# Patient Record
Sex: Male | Born: 2000 | Race: Black or African American | Hispanic: No | Marital: Single | Smoking: Never smoker
Health system: Southern US, Community
[De-identification: ages and names within clinical notes are randomized; demographics above are authoritative.]

## PROBLEM LIST (undated history)

## (undated) DIAGNOSIS — T7840XA Allergy, unspecified, initial encounter: Secondary | ICD-10-CM

## (undated) DIAGNOSIS — J45909 Unspecified asthma, uncomplicated: Secondary | ICD-10-CM

## (undated) DIAGNOSIS — F419 Anxiety disorder, unspecified: Secondary | ICD-10-CM

## (undated) HISTORY — DX: Anxiety disorder, unspecified: F41.9

## (undated) HISTORY — DX: Unspecified asthma, uncomplicated: J45.909

## (undated) HISTORY — DX: Allergy, unspecified, initial encounter: T78.40XA

---
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
## 2019-01-08 ENCOUNTER — Other Ambulatory Visit: Payer: Self-pay | Admitting: Family Medicine

## 2019-02-10 ENCOUNTER — Ambulatory Visit: Payer: Medicaid Other | Admitting: Family Medicine

## 2019-02-26 ENCOUNTER — Other Ambulatory Visit: Payer: Self-pay | Admitting: Family Medicine

## 2019-06-19 ENCOUNTER — Other Ambulatory Visit: Payer: Self-pay | Admitting: Family Medicine

## 2019-07-07 ENCOUNTER — Ambulatory Visit (INDEPENDENT_AMBULATORY_CARE_PROVIDER_SITE_OTHER): Payer: Self-pay | Admitting: Family Medicine

## 2019-07-07 ENCOUNTER — Other Ambulatory Visit: Payer: Self-pay

## 2019-07-07 ENCOUNTER — Encounter: Payer: Self-pay | Admitting: Family Medicine

## 2019-07-07 VITALS — BP 110/64 | HR 73 | Ht 69.75 in | Wt 166.0 lb

## 2019-07-07 DIAGNOSIS — Z23 Encounter for immunization: Secondary | ICD-10-CM

## 2019-07-07 DIAGNOSIS — Z0000 Encounter for general adult medical examination without abnormal findings: Secondary | ICD-10-CM

## 2019-07-07 DIAGNOSIS — Z113 Encounter for screening for infections with a predominantly sexual mode of transmission: Secondary | ICD-10-CM

## 2019-07-07 NOTE — Progress Notes (Signed)
Adolescent Well Care Visit Perlie Goldubrey Cohill is a 18 y.o. male who is here for well care.    PCP:  Gianelle Mccaul, [place], DO   History was provided by the patient.  Confidentiality was discussed with the patient and, if applicable, with caregiver as well.  Current Issues: Current concerns include none.   Patient is starting college in the fall and is here for his physical prior to going to college as well as his meningitis vaccine.  Nutrition: Nutrition/Eating Behaviors: Eats a well-balanced diet.  Exercise/ Media: Play any Sports?/ Exercise: He exercises and play sports with friends Screen Time:  > 2 hours-counseling provided Media Rules or Monitoring?: no  Sleep:  Sleep: Patient reports that he sleeps well at night  Social Screening: Lives with: Family now but is moving on to [place] Parental relations:  good Activities, Work, and Regulatory affairs officerChores?:  Yes Concerns regarding behavior with peers?  no Stressors of note: He did miss his senior year so he is hoping that he is able to have some normalcy when he starts college although he is aware that he may be doing mostly online classes  Education: School Name: [employer], then transfer to [employer] Grade: first year college School performance: doing well; no concerns School Behavior: doing well; no concerns  Confidential Social History: Tobacco?  no Secondhand smoke exposure?  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  Yes, uses protection   Pregnancy Prevention: uses condoms  Safe at home, in school & in relationships?  Yes Safe to self?  Yes   The patient completed the Rapid Assessment of Adolescent Preventive Services (RAAPS) questionnaire, and identified the following as issues: eating habits, exercise habits, safety equipment use, bullying, abuse and/or trauma, weapon use, tobacco use, other substance use, reproductive health and mental health.  Issues were addressed and counseling provided.  Additional topics  were addressed as anticipatory guidance.  PHQ-9-2 completed and results indicated normal  Physical Exam:  Vitals:   07/07/19 1441  BP: 110/64  Pulse: 73  SpO2: 99%  Weight: 166 lb (75.3 kg)  Height: 5' 9.75" (1.772 m)   BP 110/64   Pulse 73   Ht 5' 9.75" (1.772 m)   Wt 166 lb (75.3 kg)   SpO2 99%   BMI 23.99 kg/m  Body mass index: body mass index is 23.99 kg/m. Blood pressure percentiles are not available for patients who are 18 years or older.  No exam data present  General Appearance:   alert, oriented, no acute distress and well nourished  HENT: Normocephalic, no obvious abnormality, conjunctiva clear  Mouth:   Normal appearing teeth   Neck:   Supple; thyroid: no enlargement, symmetric, no tenderness/mass/nodules  Chest RRR, no m/g/r  Lungs:   Clear to auscultation bilaterally, normal work of breathing  Heart:   Regular rate and rhythm, S1 and S2 normal, no murmurs;   Abdomen:   Soft, non-tender, no mass, or organomegaly  GU genitalia not examined  Musculoskeletal:   Tone and strength strong and symmetrical, all extremities               Lymphatic:   No cervical adenopathy  Skin/Hair/Nails:   Skin warm, dry and intact, no rashes, no bruises or petechiae  Neurologic:   Strength, gait, and coordination normal and age-appropriate     Assessment and Plan:   Here for physical before college, patient doing well with no concerns.  Patient was to have HIV screening performed however our lab is backed  up so he will have this done in the future.  BMI is appropriate for age  Hearing screening result:not examined Vision screening result: not examined  Counseling provided for all of the vaccine components  Orders Placed This Encounter  Procedures  . Meningococcal MCV4O  . HIV Antibody (routine testing w rflx)     Follow-up in 1 year or sooner as needed.   [place] Karmen Altamirano, DO PGY-3, [facility]

## 2019-07-07 NOTE — Patient Instructions (Signed)
Thank you for coming to see me today. It was a pleasure!   Please follow-up in one or sooner as needed.  If you have any questions or concerns, please do not hesitate to call the office at [phone].  Take Care,   Eddie Nevayah Faust, DO   Preventive Care 19-19 Years Old, Male Preventive care refers to lifestyle choices and visits with your health care provider that can promote health and wellness. At this stage in your life, you may start seeing a primary care physician instead of a pediatrician. Your health care is now your responsibility. Preventive care for young adults includes:  A yearly physical exam. This is also called an annual wellness visit.  Regular dental and eye exams.  Immunizations.  Screening for certain conditions.  Healthy lifestyle choices, such as diet and exercise. What can I expect for my preventive care visit? Physical exam Your health care provider may check:  Height and weight. These may be used to calculate body mass index (BMI), which is a measurement that tells if you are at a healthy weight.  Heart rate and blood pressure.  Body temperature. Counseling Your health care provider may ask you questions about:  Past medical problems and family medical history.  Alcohol, tobacco, and drug use.  Home and relationship well-being.  Access to firearms.  Emotional well-being.  Diet, exercise, and sleep habits.  Sexual activity and sexual health. What immunizations do I need?  Influenza (flu) vaccine  This is recommended every year. Tetanus, diphtheria, and pertussis (Tdap) vaccine  You may need a Td booster every 10 years. Varicella (chickenpox) vaccine  You may need this vaccine if you have not already been vaccinated. Human papillomavirus (HPV) vaccine  If recommended by your health care provider, you may need three doses over 6 months. Measles, mumps, and rubella (MMR) vaccine  You may need at least one dose of MMR. You may also  need a second dose. Meningococcal conjugate (MenACWY) vaccine  One dose is recommended if you are 19-65 years old and a Market researcher living in a residence hall, or if you have one of several medical conditions. You may also need additional booster doses. Pneumococcal conjugate (PCV13) vaccine  You may need this if you have certain conditions and were not previously vaccinated. Pneumococcal polysaccharide (PPSV23) vaccine  You may need one or two doses if you smoke cigarettes or if you have certain conditions. Hepatitis A vaccine  You may need this if you have certain conditions or if you travel or work in places where you may be exposed to hepatitis A. Hepatitis B vaccine  You may need this if you have certain conditions or if you travel or work in places where you may be exposed to hepatitis B. Haemophilus influenzae type b (Hib) vaccine  You may need this if you have certain risk factors. You may receive vaccines as individual doses or as more than one vaccine together in one shot (combination vaccines). Talk with your health care provider about the risks and benefits of combination vaccines. What tests do I need? Blood tests  Lipid and cholesterol levels. These may be checked every 5 years starting at age 19.  Hepatitis C test.  Hepatitis B test. Screening  Genital exam to check for testicular cancer or hernias.  Sexually transmitted disease (STD) testing, if you are at risk. Other tests  Tuberculosis skin test.  Vision and hearing tests.  Skin exam. Follow these instructions at home: Eating and drinking  Eat a diet that includes fresh fruits and vegetables, whole grains, lean protein, and low-fat dairy products.  Drink enough fluid to keep your urine pale yellow.  Do not drink alcohol if: ? Your health care provider tells you not to drink. ? You are under the legal drinking age. In the [place] the legal drinking age is 19.  If you drink alcohol: ?  Limit how much you have to 0-2 drinks a day. ? Be aware of how much alcohol is in your drink. In the [place] one drink equals one 12 oz bottle of beer (355 mL), one 5 oz glass of wine (148 mL), or one 1 oz glass of hard liquor (44 mL). Lifestyle  Take daily care of your teeth and gums.  Stay active. Exercise at least 30 minutes 5 or more days of the week.  Do not use any products that contain nicotine or tobacco, such as cigarettes, e-cigarettes, and chewing tobacco. If you need help quitting, ask your health care provider.  Do not use drugs.  If you are sexually active, practice safe sex. Use a condom or other form of protection to prevent STIs (sexually transmitted infections).  Find healthy ways to cope with stress, such as: ? Meditation, yoga, or listening to music. ? Journaling. ? Talking to a trusted person. ? Spending time with friends and family. Safety  Always wear your seat belt while driving or riding in a vehicle.  Do not drive if you have been drinking alcohol.  Do not ride with someone who has been drinking.  Do not drive when you are tired or distracted.  Do not text while driving.  Wear a helmet and other protective equipment during sports activities.  If you have firearms in your house, make sure you follow all gun safety procedures.  Seek help if you have been bullied, physically abused, or sexually abused.  Use the Internet responsibly to avoid dangers such as online bullying and online sex predators. What's next?  Go to your health care provider once a year for a well check visit.  Ask your health care provider how often you should have your eyes and teeth checked.  Stay up to date on all vaccines. This information is not intended to replace advice given to you by your health care provider. Make sure you discuss any questions you have with your health care provider. Document Released: 04/12/2016 Document Revised: 11/20/2018 Document Reviewed:  11/20/2018 Elsevier Patient Education  2020 [employer].

## 2019-08-04 ENCOUNTER — Other Ambulatory Visit: Payer: Self-pay | Admitting: Family Medicine

## 2019-09-12 ENCOUNTER — Other Ambulatory Visit: Payer: Self-pay | Admitting: Family Medicine

## 2019-11-19 ENCOUNTER — Other Ambulatory Visit: Payer: Self-pay | Admitting: Family Medicine

## 2020-01-14 ENCOUNTER — Other Ambulatory Visit: Payer: Self-pay | Admitting: *Deleted

## 2020-01-14 ENCOUNTER — Other Ambulatory Visit: Payer: Self-pay | Admitting: Family Medicine

## 2020-01-14 MED ORDER — ALBUTEROL SULFATE HFA 108 (90 BASE) MCG/ACT IN AERS: INHALATION_SPRAY | RESPIRATORY_TRACT | 0 refills | Status: DC

## 2020-02-27 ENCOUNTER — Other Ambulatory Visit: Payer: Self-pay | Admitting: Family Medicine

## 2020-07-15 ENCOUNTER — Other Ambulatory Visit: Payer: Self-pay

## 2020-07-18 ENCOUNTER — Other Ambulatory Visit: Payer: Self-pay | Admitting: *Deleted

## 2020-07-20 MED ORDER — ALBUTEROL SULFATE HFA 108 (90 BASE) MCG/ACT IN AERS: INHALATION_SPRAY | RESPIRATORY_TRACT | 1 refills | Status: DC

## 2020-07-20 MED ORDER — CETIRIZINE HCL 10 MG PO TABS: 10.0000 mg | ORAL_TABLET | Freq: Every day | ORAL | 11 refills | Status: DC

## 2020-07-20 MED ORDER — ALBUTEROL SULFATE (2.5 MG/3ML) 0.083% IN NEBU: INHALATION_SOLUTION | RESPIRATORY_TRACT | 1 refills | Status: DC

## 2020-07-26 ENCOUNTER — Encounter: Payer: Self-pay | Admitting: Family Medicine

## 2020-07-26 NOTE — Progress Notes (Deleted)
    SUBJECTIVE:   Chief compliant/HPI: annual examination  Eddie Boyer is a 19 y.o. who presents today for an annual exam.   Reviewed and updated history ***.   Review of systems form notable for ***.    OBJECTIVE:   There were no vitals taken for this visit.  ***  ASSESSMENT/PLAN:   No problem-specific Assessment & Plan notes found for this encounter.    Annual Examination  See AVS for age appropriate recommendations  PHQ score ***, reviewed and discussed.  Blood pressure reviewed and at goal. ***   Advanced directive ***   Considered the following items based upon USPSTF recommendations: HIV testing: {not indicated/requested/declined:14582} Hepatitis C: {not indicated/requested/declined:14582} Hepatitis B: {not indicated/requested/declined:14582} Syphilis if at high risk: {{not indicated/requested/declined:14582} GC/CT{not indicated/requested/declined:14582} Lipid panel (nonfasting or fasting) discussed based upon AHA recommendations and {ordered not order:23822}.  Consider repeat every 4-6 years.  Reviewed risk factors for latent tuberculosis and {not indicated/requested/declined:14582} Immunizations ***   Follow up in 1 year or sooner if indicated.    Lavonda Jumbo, DO [facility]

## 2021-01-20 ENCOUNTER — Encounter: Payer: Self-pay | Admitting: Family Medicine

## 2021-01-20 ENCOUNTER — Ambulatory Visit (INDEPENDENT_AMBULATORY_CARE_PROVIDER_SITE_OTHER): Payer: Medicaid Other | Admitting: Family Medicine

## 2021-01-20 ENCOUNTER — Other Ambulatory Visit: Payer: Self-pay

## 2021-01-20 VITALS — BP 100/62 | HR 105 | Wt 160.0 lb

## 2021-01-20 DIAGNOSIS — Z23 Encounter for immunization: Secondary | ICD-10-CM | POA: Diagnosis not present

## 2021-01-20 DIAGNOSIS — Z0000 Encounter for general adult medical examination without abnormal findings: Secondary | ICD-10-CM | POA: Diagnosis not present

## 2021-01-20 DIAGNOSIS — M545 Low back pain, unspecified: Secondary | ICD-10-CM

## 2021-01-20 MED ORDER — NAPROXEN 500 MG PO TABS: 500.0000 mg | ORAL_TABLET | Freq: Two times a day (BID) | ORAL | 0 refills | Status: AC

## 2021-01-20 NOTE — Patient Instructions (Addendum)
Please continue to use heat 20 min several times a day.   Take Naproxen twice daily with food. DO not take with ibuprofen. Can take with tylenol up to 4000mg  daily (max 1000mg  at a time 3-4 times/day).  Follow up if your back isnt improved in the next 2-3 weeks.
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
## 2021-01-21 ENCOUNTER — Encounter: Payer: Self-pay | Admitting: Family Medicine

## 2021-01-21 DIAGNOSIS — Z0000 Encounter for general adult medical examination without abnormal findings: Secondary | ICD-10-CM | POA: Insufficient documentation

## 2021-01-21 DIAGNOSIS — M545 Low back pain, unspecified: Secondary | ICD-10-CM | POA: Insufficient documentation

## 2021-01-21 NOTE — Assessment & Plan Note (Signed)
Tdap vaccine given today.  Declined flu and STD screening at this time.
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
## 2021-02-07 ENCOUNTER — Other Ambulatory Visit: Payer: Self-pay | Admitting: Family Medicine

## 2021-02-13 ENCOUNTER — Other Ambulatory Visit: Payer: Self-pay

## 2021-02-14 MED ORDER — ALBUTEROL SULFATE HFA 108 (90 BASE) MCG/ACT IN AERS: INHALATION_SPRAY | RESPIRATORY_TRACT | 1 refills | Status: DC

## 2021-08-15 ENCOUNTER — Ambulatory Visit (INDEPENDENT_AMBULATORY_CARE_PROVIDER_SITE_OTHER): Payer: Medicaid Other | Admitting: Family Medicine

## 2021-08-15 ENCOUNTER — Other Ambulatory Visit (HOSPITAL_COMMUNITY)
Admission: RE | Admit: 2021-08-15 | Discharge: 2021-08-15 | Disposition: A | Discharge status: Home / Self Care | Payer: Medicaid Other | Source: Ambulatory Visit | Attending: Family Medicine | Admitting: Family Medicine

## 2021-08-15 ENCOUNTER — Other Ambulatory Visit: Payer: Self-pay

## 2021-08-15 VITALS — BP 118/64 | HR 70 | Ht 69.5 in | Wt 172.0 lb

## 2021-08-15 DIAGNOSIS — Z202 Contact with and (suspected) exposure to infections with a predominantly sexual mode of transmission: Secondary | ICD-10-CM | POA: Insufficient documentation

## 2021-08-15 MED ORDER — CEFTRIAXONE SODIUM 500 MG IJ SOLR: 500.0000 mg | Freq: Once | INTRAMUSCULAR | Status: DC

## 2021-08-15 MED ORDER — CEFTRIAXONE SODIUM 1 G IJ SOLR
0.5000 g | Freq: Once | INTRAMUSCULAR | Status: AC
  Administered 2021-08-15: 0.5 g via INTRAMUSCULAR

## 2021-08-15 MED ORDER — AZITHROMYCIN 500 MG PO TABS: 1000.0000 mg | ORAL_TABLET | Freq: Once | ORAL | Status: DC

## 2021-08-15 MED ORDER — DOXYCYCLINE HYCLATE 100 MG PO TABS: 100.0000 mg | ORAL_TABLET | Freq: Two times a day (BID) | ORAL | 0 refills | Status: AC

## 2021-08-15 NOTE — Patient Instructions (Signed)
Thank you for coming to see me today. It was a pleasure. Today we discussed a potential STD exposure. I I have treated you for gonorrhea and chlamydia just in case. Please check with your partner to see whether she was treated for an STD or a different type of infection.  Always use condoms to prevent catching an STD.  Please follow-up as and when you need to.   If you have any questions or concerns, please do not hesitate to call the office at [phone].  Best wishes,   Dr Allena Katz
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
## 2021-08-16 ENCOUNTER — Encounter: Payer: Self-pay | Admitting: Family Medicine

## 2021-08-16 LAB — HEPATITIS C ANTIBODY: Hep C Virus Ab: <0.1 s/co ratio (ref 0.0–0.9)

## 2021-08-16 LAB — HIV ANTIBODY (ROUTINE TESTING W REFLEX): HIV Screen 4th Generation wRfx: NONREACTIVE

## 2021-08-16 LAB — RPR: RPR Ser Ql: NONREACTIVE

## 2021-08-17 LAB — URINE CYTOLOGY ANCILLARY ONLY
Chlamydia: NEGATIVE
Comment: NEGATIVE
Comment: NEGATIVE
Comment: NORMAL
Neisseria Gonorrhea: NEGATIVE
Trichomonas: NEGATIVE

## 2021-10-06 ENCOUNTER — Other Ambulatory Visit: Payer: Self-pay | Admitting: Family Medicine

## 2021-10-09 ENCOUNTER — Other Ambulatory Visit: Payer: Self-pay | Admitting: *Deleted

## 2022-01-31 ENCOUNTER — Encounter: Payer: Medicaid Other | Admitting: Family Medicine

## 2022-02-01 ENCOUNTER — Encounter: Payer: Medicaid Other | Admitting: Family Medicine

## 2022-02-22 ENCOUNTER — Other Ambulatory Visit: Payer: Self-pay | Admitting: Family Medicine

## 2022-03-13 ENCOUNTER — Other Ambulatory Visit: Payer: Self-pay | Admitting: *Deleted

## 2022-03-13 MED ORDER — PROAIR HFA 108 (90 BASE) MCG/ACT IN AERS
INHALATION_SPRAY | RESPIRATORY_TRACT | 1 refills | Status: DC
Start: 2022-03-13 — End: 2022-03-20

## 2022-03-20 MED ORDER — ALBUTEROL SULFATE HFA 108 (90 BASE) MCG/ACT IN AERS: 2.0000 | INHALATION_SPRAY | Freq: Four times a day (QID) | RESPIRATORY_TRACT | 0 refills | Status: DC | PRN

## 2022-03-20 NOTE — Addendum Note (Signed)
Addended by: Steva Colder on: 03/20/2022 01:54 PM ? ? Modules accepted: Orders ? ?

## 2022-03-20 NOTE — Telephone Encounter (Signed)
Proair is no longer being made.  ? ?Prescription needs to be changed to Ventolin.  ? ?Will send in. ?

## 2022-05-15 ENCOUNTER — Encounter: Payer: Self-pay | Admitting: *Deleted

## 2022-06-21 ENCOUNTER — Other Ambulatory Visit: Payer: Self-pay | Admitting: Family Medicine

## 2022-06-22 MED ORDER — ALBUTEROL SULFATE HFA 108 (90 BASE) MCG/ACT IN AERS
2.0000 | INHALATION_SPRAY | Freq: Four times a day (QID) | RESPIRATORY_TRACT | 0 refills | Status: DC | PRN
Start: 2022-06-22 — End: 2022-08-03

## 2022-07-06 ENCOUNTER — Ambulatory Visit: Payer: Medicaid Other | Admitting: Student

## 2022-07-30 NOTE — Patient Instructions (Incomplete)
It was great to see you! Thank you for allowing me to participate in your care!  I recommend that you always bring your medications to each appointment as this makes it easy to ensure we are on the correct medications and helps [place] not miss when refills are needed.  Our plans for today:  - Sexually transmitted infection testing  HIV/Syphilis/Gonorrhea/Chlamydia  Treating for Gonorrhea - Ceftriaxone 0.5 g  - Health Maintenance  Hep B testing  Lipid panel  Hgb A1c  - [facility] Post[employer]ac/Master's in [employer] out/introduce yourself via e-mail to Dr. Haskell Riling, he'd be a good resource to hear about the program.   We are checking some labs today, I will call you if they are abnormal will send you a MyChart message or a letter if they are normal.  If you do not hear about your labs in the next 2 weeks please let us know.  Take care and seek immediate care sooner if you develop any concerns.   Dr. Bess Kinds, MD [facility]
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
## 2022-07-31 ENCOUNTER — Other Ambulatory Visit: Payer: Self-pay

## 2022-07-31 ENCOUNTER — Ambulatory Visit (INDEPENDENT_AMBULATORY_CARE_PROVIDER_SITE_OTHER): Payer: Medicaid Other | Admitting: Student

## 2022-07-31 ENCOUNTER — Encounter: Payer: Self-pay | Admitting: Student

## 2022-07-31 VITALS — BP 133/83 | HR 91 | Wt 183.6 lb

## 2022-07-31 DIAGNOSIS — Z0000 Encounter for general adult medical examination without abnormal findings: Secondary | ICD-10-CM

## 2022-07-31 DIAGNOSIS — Z202 Contact with and (suspected) exposure to infections with a predominantly sexual mode of transmission: Secondary | ICD-10-CM

## 2022-07-31 LAB — POCT GLYCOSYLATED HEMOGLOBIN (HGB A1C): Hemoglobin A1C: 5 % (ref 4.0–5.6)

## 2022-07-31 MED ORDER — CEFTRIAXONE SODIUM 1 G IJ SOLR
0.5000 g | Freq: Once | INTRAMUSCULAR | Status: AC
  Administered 2022-07-31: 0.5 g via INTRAMUSCULAR

## 2022-08-01 ENCOUNTER — Encounter: Payer: Self-pay | Admitting: Student

## 2022-08-01 LAB — HIV ANTIBODY (ROUTINE TESTING W REFLEX): HIV Screen 4th Generation wRfx: NONREACTIVE

## 2022-08-01 LAB — HEPATITIS B SURFACE ANTIBODY,QUALITATIVE: Hep B Surface Ab, Qual: NONREACTIVE

## 2022-08-01 LAB — LIPID PANEL
Chol/HDL Ratio: 2.4 ratio (ref 0.0–5.0)
Cholesterol, Total: 144 mg/dL (ref 100–199)
HDL: 59 mg/dL (ref >39)
LDL Chol Calc (NIH): 75 mg/dL (ref 0–99)
Triglycerides: 44 mg/dL (ref 0–149)
VLDL Cholesterol Cal: 10 mg/dL (ref 5–40)

## 2022-08-01 LAB — RPR: RPR Ser Ql: NONREACTIVE

## 2022-08-03 ENCOUNTER — Other Ambulatory Visit: Payer: Self-pay | Admitting: Student

## 2022-08-04 ENCOUNTER — Other Ambulatory Visit: Payer: Self-pay | Admitting: Student

## 2022-08-04 DIAGNOSIS — Z202 Contact with and (suspected) exposure to infections with a predominantly sexual mode of transmission: Secondary | ICD-10-CM

## 2022-08-04 MED ORDER — ALBUTEROL SULFATE HFA 108 (90 BASE) MCG/ACT IN AERS: 2.0000 | INHALATION_SPRAY | Freq: Four times a day (QID) | RESPIRATORY_TRACT | 0 refills | Status: DC | PRN

## 2022-08-04 NOTE — Progress Notes (Signed)
Patient to come drop urine sample on Monday 08/06/22

## 2022-08-06 ENCOUNTER — Other Ambulatory Visit (HOSPITAL_COMMUNITY)
Admission: RE | Admit: 2022-08-06 | Discharge: 2022-08-06 | Disposition: A | Discharge status: Home / Self Care | Payer: Medicaid Other | Source: Ambulatory Visit | Attending: Family Medicine | Admitting: Family Medicine

## 2022-08-06 DIAGNOSIS — Z202 Contact with and (suspected) exposure to infections with a predominantly sexual mode of transmission: Secondary | ICD-10-CM | POA: Insufficient documentation

## 2022-08-07 LAB — URINE CYTOLOGY ANCILLARY ONLY
Chlamydia: NEGATIVE
Comment: NEGATIVE
Comment: NORMAL
Neisseria Gonorrhea: NEGATIVE

## 2022-10-09 ENCOUNTER — Other Ambulatory Visit: Payer: Self-pay | Admitting: *Deleted

## 2022-10-10 MED ORDER — CETIRIZINE HCL 10 MG PO TABS: ORAL_TABLET | ORAL | 11 refills | Status: DC

## 2022-10-15 ENCOUNTER — Encounter: Payer: Self-pay | Admitting: Student

## 2022-10-15 ENCOUNTER — Ambulatory Visit (INDEPENDENT_AMBULATORY_CARE_PROVIDER_SITE_OTHER): Payer: Medicaid Other | Admitting: Student

## 2022-10-15 VITALS — BP 110/74 | HR 53 | Ht 69.5 in | Wt 180.4 lb

## 2022-10-15 DIAGNOSIS — N4889 Other specified disorders of penis: Secondary | ICD-10-CM | POA: Diagnosis present

## 2022-10-15 NOTE — Patient Instructions (Signed)
It was great to see you! Thank you for allowing me to participate in your care!  Our plans for today:  - You may have a fungal infection of your penis. Try over the counter clotrimazole or lotrimin twice a day for two weeks - F/u in 2 weeks if not better  To discuss career goals/plan Email me: [email or web address]  Take care and seek immediate care sooner if you develop any concerns.   Dr. Holley Bouche, MD [place]

## 2022-10-15 NOTE — Progress Notes (Signed)
  SUBJECTIVE:   CHIEF COMPLAINT / HPI:   Foreskin irritation:  Foreskin has been looking dry like dead skin on his penis. Skin can be dry and split/crack when he peels off the skin or get's an erection. Wears compresion leggings everyday. Had this issues months ago, and he changed his soap. Has been a constant issues for 2-3 weeks. Looks ok in the shower, and he's able to peel of skin, but the next day his foreskin looks dry again. Denies any swelling, discharge or pus drainage, but is red sometimes but does not bleed.   PERTINENT  PMH / PSH:   No past medical history on file.  OBJECTIVE:  BP 110/74   Pulse (!) 53   Ht 5' 9.5" (1.765 m)   Wt 180 lb 6.4 oz (81.8 kg)   SpO2 98%   BMI 26.26 kg/m  Physical Exam Genitourinary:    Penis: Circumcised. Erythema (minnimal erythema, with dryness on shaft) present. No tenderness, discharge, swelling or lesions.      Testes: Normal.        Right: Mass, tenderness or swelling not present.        Left: Mass, tenderness or swelling not present.      ASSESSMENT/PLAN:  Penile irritation Assessment & Plan: Patient complains of dry peeling skin located on shaft of penis for last 2-3 weeks. Noted it had happened a little while ago and he changed soaps. Notes no new sexual partners, or discharge/pain. Patient likely suffering from fungal infection given location of irritation and physical exam. Patient may also be suffering from dermatitis, but will trial OTC antifungal first.  -OTC antifungal (clotrimazole, lotrimin) daily for 2 weeks -F/u 2 weeks if irritation not improved.     No follow-ups on file. Holley Bouche, MD 10/18/2022, 5:53 PM PGY-2, [place]

## 2022-10-18 DIAGNOSIS — N4889 Other specified disorders of penis: Secondary | ICD-10-CM | POA: Insufficient documentation

## 2022-10-18 NOTE — Assessment & Plan Note (Signed)
Patient complains of dry peeling skin located on shaft of penis for last 2-3 weeks. Noted it had happened a little while ago and he changed soaps. Notes no new sexual partners, or discharge/pain. Patient likely suffering from fungal infection given location of irritation and physical exam. Patient may also be suffering from dermatitis, but will trial OTC antifungal first.  -OTC antifungal (clotrimazole, lotrimin) daily for 2 weeks -F/u 2 weeks if irritation not improved.

## 2022-11-07 ENCOUNTER — Other Ambulatory Visit: Payer: Self-pay | Admitting: Student

## 2023-06-27 ENCOUNTER — Other Ambulatory Visit: Payer: Self-pay | Admitting: Student

## 2023-08-16 ENCOUNTER — Other Ambulatory Visit: Payer: Self-pay | Admitting: *Deleted

## 2023-08-16 MED ORDER — ALBUTEROL SULFATE HFA 108 (90 BASE) MCG/ACT IN AERS: 2.0000 | INHALATION_SPRAY | Freq: Four times a day (QID) | RESPIRATORY_TRACT | 0 refills | Status: DC | PRN

## 2023-10-20 ENCOUNTER — Other Ambulatory Visit: Payer: Self-pay | Admitting: Student

## 2024-05-19 ENCOUNTER — Encounter: Payer: Self-pay | Admitting: *Deleted

## 2024-08-27 ENCOUNTER — Other Ambulatory Visit: Payer: Self-pay | Admitting: Medical Genetics

## 2024-09-03 ENCOUNTER — Ambulatory Visit: Admitting: Family Medicine

## 2024-09-03 ENCOUNTER — Other Ambulatory Visit (HOSPITAL_COMMUNITY)
Admission: RE | Admit: 2024-09-03 | Discharge: 2024-09-03 | Disposition: A | Discharge status: Home / Self Care | Source: Ambulatory Visit | Attending: Family Medicine | Admitting: Family Medicine

## 2024-09-03 VITALS — BP 138/85 | HR 78 | Temp 97.9°F | Resp 18 | Wt 235.4 lb

## 2024-09-03 DIAGNOSIS — Z113 Encounter for screening for infections with a predominantly sexual mode of transmission: Secondary | ICD-10-CM | POA: Insufficient documentation

## 2024-09-03 DIAGNOSIS — J4520 Mild intermittent asthma, uncomplicated: Secondary | ICD-10-CM | POA: Diagnosis not present

## 2024-09-03 DIAGNOSIS — J301 Allergic rhinitis due to pollen: Secondary | ICD-10-CM

## 2024-09-03 DIAGNOSIS — Z0000 Encounter for general adult medical examination without abnormal findings: Secondary | ICD-10-CM

## 2024-09-03 DIAGNOSIS — E66811 Obesity, class 1: Secondary | ICD-10-CM | POA: Diagnosis not present

## 2024-09-03 DIAGNOSIS — E6609 Other obesity due to excess calories: Secondary | ICD-10-CM | POA: Insufficient documentation

## 2024-09-03 DIAGNOSIS — Z6834 Body mass index (BMI) 34.0-34.9, adult: Secondary | ICD-10-CM

## 2024-09-03 MED ORDER — CETIRIZINE HCL 10 MG PO TABS: ORAL_TABLET | ORAL | 3 refills | Status: AC

## 2024-09-03 MED ORDER — ALBUTEROL SULFATE HFA 108 (90 BASE) MCG/ACT IN AERS: 2.0000 | INHALATION_SPRAY | Freq: Four times a day (QID) | RESPIRATORY_TRACT | 11 refills | Status: AC | PRN

## 2024-09-03 MED ORDER — ALBUTEROL SULFATE (2.5 MG/3ML) 0.083% IN NEBU
INHALATION_SOLUTION | RESPIRATORY_TRACT | 1 refills | Status: AC
Start: 2024-09-03 — End: ?

## 2024-09-03 NOTE — Progress Notes (Signed)
 Assessment  Assessment/Plan:   Problem List Items Addressed This Visit     Allergic rhinitis   Controlled on as needed Zyrtec  -Continue Zyrtec  10 mg nightly. -Medication refilled      Relevant Medications   cetirizine  (ZYRTEC ) 10 MG tablet   Asthma   No current exacerbations.  Currently managed with rescue albuterol . -Refill albuterol  inhaler and nebulizer per patient request      Relevant Medications   albuterol  (VENTOLIN  HFA) 108 (90 Base) MCG/ACT inhaler   albuterol  (PROVENTIL ) (2.5 MG/3ML) 0.083% nebulizer solution   Class 1 obesity due to excess calories with serious comorbidity and body mass index (BMI) of 34.0 to 34.9 in adult   Relevant Orders   TSH   Lipid panel   Hemoglobin A1c   Microalbumin / creatinine urine ratio   CBC with Differential/Platelet   Comprehensive metabolic panel with GFR   Urinalysis w microscopic + reflex cultur   Other Visit Diagnoses       Encounter for well adult exam without abnormal findings    -  Primary     Screen for STD (sexually transmitted disease)       Relevant Orders   HCV Ab w Reflex to Quant PCR   HIV Antibody (routine testing w rflx)   RPR   Urine cytology ancillary only   Hepatitis B core antibody, total   Hepatitis B surface antibody,qualitative   Hepatitis B surface antigen         Medications Discontinued During This Encounter  Medication Reason   cetirizine  (ZYRTEC ) 10 MG tablet Reorder   albuterol  (PROVENTIL ) (2.5 MG/3ML) 0.083% nebulizer solution Reorder   albuterol  (VENTOLIN  HFA) 108 (90 Base) MCG/ACT inhaler Reorder    Patient Counseling(The following topics were reviewed and/or handout was given):  -Nutrition: Stressed importance of moderation in sodium/caffeine intake, saturated fat and cholesterol, caloric balance, sufficient intake of fresh fruits, vegetables, and fiber.  -Stressed the importance of regular exercise.   -Substance Abuse: Discussed cessation/primary prevention of tobacco, alcohol,  or other drug use; driving or other dangerous activities under the influence; availability of treatment for abuse.   -Injury prevention: Discussed safety belts, safety helmets, smoke detector, smoking near bedding or upholstery.   -Sexuality: Discussed sexually transmitted diseases, partner selection, use of condoms, avoidance of unintended pregnancy and contraceptive alternatives.   -Dental health: Discussed importance of regular tooth brushing, flossing, and dental visits.  -Health maintenance and immunizations reviewed. Please refer to Health maintenance section.  Return in about 1 year (around 09/03/2025) for physical (fasting labs).        Subjective:   Encounter date: 09/03/2024  Chief Complaint  Patient presents with   Establish Care    Pt is fasting today   HM due- vaccinations    Exposure to STD    Pt is requesting STD check up; last done 2023    Asthma Patient with a history of mild intermittent asthma.  Uses rescue albuterol  inhaler or nebulizer depending on severity of symptoms.  He has not used a controller inhaler.  Reports no current use of rescue inhalers over the past month.  Reports that asthma is typically worse in allergy season  Seasonal allergy Patient uses cetirizine  10 mg tablet nightly as needed.  Typical symptoms include watery itchy eyes and runny nose.  Usually occurs in early spring.  Not taking recently due to not eating.  STD screening Patient request STD screening.  Denies any symptoms such as dysuria, hematuria, genital discharge, genital sores.  Denies any recent contact with STD.     09/03/2024    2:32 PM 10/15/2022    3:35 PM 07/31/2022    2:24 PM 08/15/2021   11:13 AM 01/20/2021    4:56 PM  Depression screen PHQ 2/9  Decreased Interest 1 0 0 0 0  Down, Depressed, Hopeless 0 0 0 0 0  PHQ - 2 Score 1 0 0 0 0  Altered sleeping 0 1 1 2  0  Tired, decreased energy 1 0 1 1 1   Change in appetite 0 0 0 0 0  Feeling bad or failure about yourself  0  0 0 0 0  Trouble concentrating 0 0 0 0 0  Moving slowly or fidgety/restless 0 0 0 0 0  Suicidal thoughts 0 0 0 0 0  PHQ-9 Score 2 1 2 3 1   Difficult doing work/chores Not difficult at all Not difficult at all Not difficult at all Not difficult at all Somewhat difficult       09/03/2024    2:32 PM  GAD 7 : Generalized Anxiety Score  Nervous, Anxious, on Edge 1  Control/stop worrying 0  Worry too much - different things 0  Trouble relaxing 0  Restless 0  Easily annoyed or irritable 1  Afraid - awful might happen 0  Total GAD 7 Score 2  Anxiety Difficulty Not difficult at all    Health Maintenance Due  Topic Date Due   Meningococcal B Vaccine (1 of 2 - Standard) Never done   Pneumococcal Vaccine (1 of 2 - PCV) 10/23/2019   Influenza Vaccine  07/10/2024   COVID-19 Vaccine (1 - 2024-25 season) Never done      PMH:  The following were reviewed and entered/updated in epic: Past Medical History:  Diagnosis Date   Allergy    Anxiety    Asthma     Patient Active Problem List   Diagnosis Date Noted   Class 1 obesity due to excess calories with serious comorbidity and body mass index (BMI) of 34.0 to 34.9 in adult 09/03/2024   Penile irritation 10/18/2022   Potential exposure to STD 08/15/2021   Low back pain 01/21/2021   Healthcare maintenance 01/21/2021   Asthma 01/15/2011   Allergic rhinitis 02/06/2007    History reviewed. No pertinent surgical history.  Family History  Problem Relation Age of Onset   Asthma Mother     Medications- reviewed and updated Outpatient Medications Prior to Visit  Medication Sig Dispense Refill   naproxen  (NAPROSYN ) 500 MG tablet Take 1 tablet (500 mg total) by mouth 2 (two) times daily with a meal. 30 tablet 0   Spacer/Aero-Holding Chambers (BREATHERITE RIGID SPACER/MASK) MISC Inhale into the lungs as directed.       albuterol  (PROVENTIL ) (2.5 MG/3ML) 0.083% nebulizer solution USE 1 VIAL VIA NEBULIZER EVERY 4 HOURS AS NEEDED FOR  SHORTNESS OF BREATH 180 mL 1   albuterol  (VENTOLIN  HFA) 108 (90 Base) MCG/ACT inhaler INHALE 2 PUFFS INTO THE LUNGS EVERY 6 HOURS AS NEEDED FOR WHEEZING OR SHORTNESS OF BREATH 18 g 0   cetirizine  (ZYRTEC ) 10 MG tablet TAKE 1 TABLET(10 MG) BY MOUTH DAILY 30 tablet 11   Facility-Administered Medications Prior to Visit  Medication Dose Route Frequency Provider Last Rate Last Admin   meningococcal polysaccharide (MENACTRA) injection 0.5 mL  0.5 mL Intramuscular Once Hairford, Amber M, MD        No Known Allergies  Social History   Socioeconomic History   Marital status: Single  Spouse name: Not on file   Number of children: Not on file   Years of education: Not on file   Highest education level: Bachelor's degree (e.g., BA, AB, BS)  Occupational History   Not on file  Tobacco Use   Smoking status: Never   Smokeless tobacco: Never  Vaping Use   Vaping status: Never Used  Substance and Sexual Activity   Alcohol use: Yes    Alcohol/week: 3.0 standard drinks of alcohol    Types: 3 Shots of liquor per week   Drug use: Not Currently    Types: Marijuana   Sexual activity: Yes    Birth control/protection: Condom  Other Topics Concern   Not on file  Social History Narrative   Not on file   Social Drivers of Health   Financial Resource Strain: High Risk (09/03/2024)   Overall Financial Resource Strain (CARDIA)    Difficulty of Paying Living Expenses: Very hard  Food Insecurity: Food Insecurity Present (09/03/2024)   Hunger Vital Sign    Worried About Running Out of Food in the Last Year: Sometimes true    Ran Out of Food in the Last Year: Sometimes true  Transportation Needs: No Transportation Needs (09/03/2024)   PRAPARE - Administrator, Civil Service (Medical): No    Lack of Transportation (Non-Medical): No  Physical Activity: Sufficiently Active (09/03/2024)   Exercise Vital Sign    Days of Exercise per Week: 7 days    Minutes of Exercise per Session: 120 min   Stress: No Stress Concern Present (09/03/2024)   [employer] of Occupational Health - Occupational Stress Questionnaire    Feeling of Stress: Only a little  Social Connections: Moderately Isolated (09/03/2024)   Social Connection and Isolation Panel    Frequency of Communication with Friends and Family: More than three times a week    Frequency of Social Gatherings with Friends and Family: More than three times a week    Attends Religious Services: More than 4 times per year    Active Member of [employer] or Organizations: No    Attends Engineer, structural: Not on file    Marital Status: Never married           Objective:  Physical Exam: BP 138/85 (BP Location: Left Arm, Patient Position: Sitting, Cuff Size: Large)   Pulse 78   Temp 97.9 F (36.6 C) (Temporal)   Resp 18   Wt 235 lb 6.4 oz (106.8 kg)   SpO2 100%   BMI 34.26 kg/m   Body mass index is 34.26 kg/m. Wt Readings from Last 3 Encounters:  09/03/24 235 lb 6.4 oz (106.8 kg)  10/15/22 180 lb 6.4 oz (81.8 kg)  07/31/22 183 lb 9.6 oz (83.3 kg)    Physical Exam   Physical Exam      Prior labs:   No results found for this or any previous visit (from the past 2160 hours).  Lab Results  Component Value Date   CHOL 144 07/31/2022   Lab Results  Component Value Date   HDL 59 07/31/2022   Lab Results  Component Value Date   LDLCALC 75 07/31/2022   Lab Results  Component Value Date   TRIG 44 07/31/2022   Lab Results  Component Value Date   CHOLHDL 2.4 07/31/2022   No results found for: LDLDIRECT  Last metabolic panel No results found for: GLUCOSE, NA, K, CL, CO2, BUN, CREATININE, EGFR, CALCIUM, PHOS, PROT, ALBUMIN, LABGLOB, AGRATIO, BILITOT, ALKPHOS,  AST, ALT, ANIONGAP  Lab Results  Component Value Date   HGBA1C 5.0 07/31/2022    Last CBC No results found for: WBC, HGB, HCT, MCV, MCH, RDW, PLT  No results found for: TSH  No  results found for: PSA1, PSA  Last vitamin D No results found for: 25OHVITD2, 25OHVITD3, VD25OH  No results found for: COLORU, CLARITYU, GLUCOSEUR, BILIRUBINUR, KETONESU, SPECGRAV, RBCUR, PHUR, PROTEINUR, UROBILINOGEN, LEUKOCYTESUR  No results found for: LABMICR, MICROALBUR   At today's visit, we discussed treatment options, associated risk and benefits, and engage in counseling as needed.  Additionally the following were reviewed: Past medical records, past medical and surgical history, family and social background, as well as relevant laboratory results, imaging findings, and specialty notes, where applicable.  This message was generated using dictation software, and as a result, it may contain unintentional typos or errors.  Nevertheless, extensive effort was made to accurately convey at the pertinent aspects of the patient visit.    There may have been are other unrelated non-urgent complaints, but due to the busy schedule and the amount of time already spent with him, time does not permit to address these issues at today's visit. Another appointment may have or has been requested to review these additional issues.     Arvella Hummer, MD, MS

## 2024-09-03 NOTE — Assessment & Plan Note (Signed)
 Controlled on as needed Zyrtec  -Continue Zyrtec  10 mg nightly. -Medication refilled

## 2024-09-03 NOTE — Assessment & Plan Note (Signed)
 No current exacerbations.  Currently managed with rescue albuterol . -Refill albuterol  inhaler and nebulizer per patient request

## 2024-09-04 LAB — URINALYSIS W MICROSCOPIC + REFLEX CULTURE
Bilirubin Urine: NEGATIVE
Glucose, UA: NEGATIVE
Hgb urine dipstick: NEGATIVE
Hyaline Cast: NONE SEEN /LPF
Leukocyte Esterase: NEGATIVE
Nitrites, Initial: NEGATIVE
Protein, ur: NEGATIVE
Specific Gravity, Urine: 1.032 (ref 1.001–1.035)
Squamous Epithelial / HPF: NONE SEEN /HPF (ref <=5)
pH: 6.5 (ref 5.0–8.0)

## 2024-09-04 LAB — COMPREHENSIVE METABOLIC PANEL WITH GFR
ALT: 15 U/L (ref 0–53)
AST: 19 U/L (ref 0–37)
Albumin: 4.7 g/dL (ref 3.5–5.2)
Alkaline Phosphatase: 69 U/L (ref 39–117)
BUN: 11 mg/dL (ref 6–23)
CO2: 25 meq/L (ref 19–32)
Calcium: 9.3 mg/dL (ref 8.4–10.5)
Chloride: 106 meq/L (ref 96–112)
Creatinine, Ser: 1.01 mg/dL (ref 0.40–1.50)
GFR: 104.56 mL/min (ref >60.00)
Glucose, Bld: 85 mg/dL (ref 70–99)
Potassium: 3.9 meq/L (ref 3.5–5.1)
Sodium: 140 meq/L (ref 135–145)
Total Bilirubin: 0.7 mg/dL (ref 0.2–1.2)
Total Protein: 7.8 g/dL (ref 6.0–8.3)

## 2024-09-04 LAB — CBC WITH DIFFERENTIAL/PLATELET
Basophils Absolute: 0 K/uL (ref 0.0–0.1)
Basophils Relative: 0.9 % (ref 0.0–3.0)
Eosinophils Absolute: 0.2 K/uL (ref 0.0–0.7)
Eosinophils Relative: 2.9 % (ref 0.0–5.0)
HCT: 44.2 % (ref 39.0–52.0)
Hemoglobin: 14.8 g/dL (ref 13.0–17.0)
Lymphocytes Relative: 36.4 % (ref 12.0–46.0)
Lymphs Abs: 1.9 K/uL (ref 0.7–4.0)
MCHC: 33.6 g/dL (ref 30.0–36.0)
MCV: 90 fl (ref 78.0–100.0)
Monocytes Absolute: 0.6 K/uL (ref 0.1–1.0)
Monocytes Relative: 11.6 % (ref 3.0–12.0)
Neutro Abs: 2.6 K/uL (ref 1.4–7.7)
Neutrophils Relative %: 48.2 % (ref 43.0–77.0)
Platelets: 258 K/uL (ref 150.0–400.0)
RBC: 4.91 Mil/uL (ref 4.22–5.81)
RDW: 14.5 % (ref 11.5–15.5)
WBC: 5.3 K/uL (ref 4.0–10.5)

## 2024-09-04 LAB — LIPID PANEL
Cholesterol: 145 mg/dL (ref 0–200)
HDL: 41 mg/dL (ref >39.00)
LDL Cholesterol: 95 mg/dL (ref 0–99)
NonHDL: 104.28
Total CHOL/HDL Ratio: 4
Triglycerides: 46 mg/dL (ref 0.0–149.0)
VLDL: 9.2 mg/dL (ref 0.0–40.0)

## 2024-09-04 LAB — HEPATITIS B SURFACE ANTIGEN: Hepatitis B Surface Ag: NONREACTIVE

## 2024-09-04 LAB — NO CULTURE INDICATED

## 2024-09-04 LAB — RPR: RPR Ser Ql: NONREACTIVE

## 2024-09-04 LAB — HEMOGLOBIN A1C: Hgb A1c MFr Bld: 5.8 % (ref 4.6–6.5)

## 2024-09-04 LAB — HCV INTERPRETATION

## 2024-09-04 LAB — HEPATITIS B SURFACE ANTIBODY,QUALITATIVE: Hep B S Ab: NONREACTIVE

## 2024-09-04 LAB — TSH: TSH: 1.06 u[IU]/mL (ref 0.35–5.50)

## 2024-09-04 LAB — HIV ANTIBODY (ROUTINE TESTING W REFLEX)
HIV 1&2 Ab, 4th Generation: NONREACTIVE
HIV FINAL INTERPRETATION: NEGATIVE

## 2024-09-04 LAB — MICROALBUMIN / CREATININE URINE RATIO
Creatinine,U: 335 mg/dL
Microalb Creat Ratio: 3.3 mg/g (ref 0.0–30.0)
Microalb, Ur: 1.1 mg/dL (ref 0.0–1.9)

## 2024-09-04 LAB — HCV AB W REFLEX TO QUANT PCR: HCV Ab: NONREACTIVE

## 2024-09-04 LAB — HEPATITIS B CORE ANTIBODY, TOTAL: Hep B Core Total Ab: NONREACTIVE

## 2024-09-07 LAB — URINE CYTOLOGY ANCILLARY ONLY
Chlamydia: NEGATIVE
Comment: NEGATIVE
Comment: NEGATIVE
Comment: NORMAL
Neisseria Gonorrhea: NEGATIVE
Trichomonas: NEGATIVE

## 2024-09-08 ENCOUNTER — Ambulatory Visit: Payer: Self-pay | Admitting: Family Medicine
